# Patient Record
Sex: Male | Born: 1985 | Race: White | Hispanic: No | Marital: Single | Smoking: Current every day smoker
Health system: Southern US, Community
[De-identification: ages and names within clinical notes are randomized; demographics above are authoritative.]

## PROBLEM LIST (undated history)

## (undated) DIAGNOSIS — E119 Type 2 diabetes mellitus without complications: Secondary | ICD-10-CM

---

## 2009-03-20 ENCOUNTER — Emergency Department: Payer: Self-pay | Admitting: Emergency Medicine

## 2011-06-23 ENCOUNTER — Emergency Department: Payer: Self-pay | Admitting: Unknown Physician Specialty

## 2013-03-24 ENCOUNTER — Emergency Department: Payer: Self-pay

## 2013-03-24 DIAGNOSIS — J309 Allergic rhinitis, unspecified: Secondary | ICD-10-CM | POA: Insufficient documentation

## 2015-06-07 ENCOUNTER — Encounter: Payer: Self-pay | Admitting: Emergency Medicine

## 2015-06-07 ENCOUNTER — Other Ambulatory Visit: Payer: Self-pay

## 2015-06-07 ENCOUNTER — Emergency Department
Admission: EM | Admit: 2015-06-07 | Discharge: 2015-06-07 | Disposition: A | Discharge status: Home / Self Care | Payer: 59 | Attending: Emergency Medicine | Admitting: Emergency Medicine

## 2015-06-07 DIAGNOSIS — T675XXA Heat exhaustion, unspecified, initial encounter: Secondary | ICD-10-CM | POA: Insufficient documentation

## 2015-06-07 DIAGNOSIS — R42 Dizziness and giddiness: Secondary | ICD-10-CM | POA: Diagnosis present

## 2015-06-07 DIAGNOSIS — X30XXXA Exposure to excessive natural heat, initial encounter: Secondary | ICD-10-CM | POA: Insufficient documentation

## 2015-06-07 DIAGNOSIS — Y998 Other external cause status: Secondary | ICD-10-CM | POA: Insufficient documentation

## 2015-06-07 DIAGNOSIS — Z720 Tobacco use: Secondary | ICD-10-CM | POA: Diagnosis not present

## 2015-06-07 DIAGNOSIS — Y9289 Other specified places as the place of occurrence of the external cause: Secondary | ICD-10-CM | POA: Diagnosis not present

## 2015-06-07 DIAGNOSIS — Y9389 Activity, other specified: Secondary | ICD-10-CM | POA: Diagnosis not present

## 2015-06-07 DIAGNOSIS — Z880 Allergy status to penicillin: Secondary | ICD-10-CM | POA: Insufficient documentation

## 2015-06-07 LAB — CK: Total CK: 103 U/L (ref 49–397)

## 2015-06-07 LAB — BASIC METABOLIC PANEL
Anion gap: 10 (ref 5–15)
BUN: 14 mg/dL (ref 6–20)
CO2: 23 mmol/L (ref 22–32)
Calcium: 9.9 mg/dL (ref 8.9–10.3)
Chloride: 106 mmol/L (ref 101–111)
Creatinine, Ser: 0.94 mg/dL (ref 0.61–1.24)
GFR calc Af Amer: >60 mL/min (ref >60)
GFR calc non Af Amer: >60 mL/min (ref >60)
Glucose, Bld: 92 mg/dL (ref 65–99)
Potassium: 3.6 mmol/L (ref 3.5–5.1)
SODIUM: 139 mmol/L (ref 135–145)

## 2015-06-07 LAB — GLUCOSE, CAPILLARY: GLUCOSE-CAPILLARY: 92 mg/dL (ref 65–99)

## 2015-06-07 LAB — CBC
HEMATOCRIT: 48.1 % (ref 40.0–52.0)
HEMOGLOBIN: 16.2 g/dL (ref 13.0–18.0)
MCH: 28.3 pg (ref 26.0–34.0)
MCHC: 33.6 g/dL (ref 32.0–36.0)
MCV: 84.2 fL (ref 80.0–100.0)
PLATELETS: 261 10*3/uL (ref 150–440)
RBC: 5.71 MIL/uL (ref 4.40–5.90)
RDW: 13.5 % (ref 11.5–14.5)
WBC: 10.8 10*3/uL — AB (ref 3.8–10.6)

## 2015-06-07 MED ORDER — SODIUM CHLORIDE 0.9 % IV SOLN
Freq: Once | INTRAVENOUS | Status: AC
  Administered 2015-06-07: 14:00:00 via INTRAVENOUS

## 2015-06-07 MED ORDER — SODIUM CHLORIDE 0.9 % IV SOLN
Freq: Once | INTRAVENOUS | Status: AC
  Administered 2015-06-07: 999 mL/h via INTRAVENOUS

## 2015-06-07 NOTE — ED Provider Notes (Signed)
[facility] Emergency Department Provider Note     Time seen: ----------------------------------------- 2:19 PM on 06/07/2015 -----------------------------------------    I have reviewed the triage vital signs and the nursing notes.   HISTORY  Chief Complaint Dizziness    HPI Jose Mcclure is a 29 y.o. male who presents ER with nausea and dizziness while driving today. Patient states after he lies down the dizziness subsides. Complains of feeling weak, states he does work out in the heat often. He had nausea today and couldn't really eat lunch due to same. Denies any fevers,  Chills, or other complaints. Patient denies any pain at this time   History reviewed. No pertinent past medical history.  There are no active problems to display for this patient.   History reviewed. No pertinent past surgical history.  Allergies Hydrocodone and Penicillins  Social History History  Substance Use Topics  . Smoking status: Current Every Day Smoker    Types: Cigarettes  . Smokeless tobacco: Not on file  . Alcohol Use: No   Review of Systems Constitutional: Negative for fever. Eyes: Negative for visual changes. ENT: Negative for sore throat. Cardiovascular: Negative for chest pain. Respiratory: Negative for shortness of breath. Gastrointestinal: Negative for abdominal pain, vomiting and diarrhea. Genitourinary: Negative for dysuria. Musculoskeletal: Negative for back pain. Skin: Negative for rash. Neurological:  positive for weakness and dizziness 10-point ROS otherwise negative.  ____________________________________________   PHYSICAL EXAM:  VITAL SIGNS: ED Triage Vitals  Enc Vitals Group     BP 06/07/15 1336 143/78 mmHg     Pulse Rate 06/07/15 1336 87     Resp 06/07/15 1336 20     Temp 06/07/15 1336 98.2 F (36.8 C)     Temp Source 06/07/15 1336 Oral     SpO2 06/07/15 1336 97 %     Weight 06/07/15 1332 280 lb (127.007 kg)     Height  06/07/15 1332 5\' 7"  (1.702 m)     Head Cir --      Peak Flow --      Pain Score --      Pain Loc --      Pain Edu? --      Excl. in GC? --     Constitutional: Alert and oriented. Well appearing and in no distress. Eyes: Conjunctivae are normal. PERRL. Normal extraocular movements. ENT   Head: Normocephalic and atraumatic.   Nose: No congestion/rhinnorhea.   Mouth/Throat: Mucous membranes are moist.   Neck: No stridor. Cardiovascular: Normal rate, regular rhythm. Normal and symmetric distal pulses are present in all extremities. No murmurs, rubs, or gallops. Respiratory: Normal respiratory effort without tachypnea nor retractions. Breath sounds are clear and equal bilaterally. No wheezes/rales/rhonchi. Gastrointestinal: Soft and nontender. No distention. No abdominal bruits.  Musculoskeletal: Nontender with normal range of motion in all extremities. No joint effusions.  No lower extremity tenderness nor edema. Neurologic:  Normal speech and language. No gross focal neurologic deficits are appreciated. Speech is normal. No gait instability. Skin:  Skin is warm, dry and intact. No rash noted. Psychiatric: Mood and affect are normal. Speech and behavior are normal. Patient exhibits appropriate insight and judgment. ____________________________________________  EKG: Interpreted by menormal sinus rhythm with sinus arrhythmia, normal axis normal intervals. No evidence of hypertrophy or acute infarction. Rate is 81 bpm  ____________________________________________  ED COURSE:  Pertinent labs & imaging results that were available during my care of the patient were reviewed by me and considered in my medical decision making (  see chart for details).  ____________________________________________    LABS (pertinent positives/negatives)  Labs Reviewed  CBC - Abnormal; Notable for the following:    WBC 10.8 (*)    All other components within normal limits  BASIC METABOLIC PANEL   GLUCOSE, CAPILLARY  CK  URINALYSIS COMPLETEWITH MICROSCOPIC ([facility] ONLY)  CBG MONITORING, ED  ____________________________________________  FINAL ASSESSMENT AND PLAN  Heat related illness  Plan: Patient with labs and imaging as dictated above.  patient is received 2 L of saline, labs are as dictated. Patient feels much better, stable for outpatient follow-up as needed.   Emily Filbert, MD   Emily Filbert, MD 06/07/15 [phone]

## 2015-06-07 NOTE — ED Notes (Signed)
C/o dizziness and lightheaded with sob, driving in vehicle when symptoms started suddenly, pt is pale in color and diaphoretic at this time, also having nausea but no vomiting

## 2015-06-07 NOTE — Discharge Instructions (Signed)
Heat-Related Illness °Heat-related illnesses occur when the body is unable to properly cool itself. The body normally cools itself by sweating. However, under some conditions sweating is not enough. In these cases, a person's body temperature rises rapidly. Very high body temperatures may damage the brain or other vital organs. Some examples of heat-related illnesses include: °· Heat stroke. This occurs when the body is unable to regulate its temperature. The body's temperature rises rapidly, the sweating mechanism fails, and the body is unable to cool down. Body temperature may rise to 106° F (41° C) or higher within 10 to 15 minutes. Heat stroke can cause death or permanent disability if emergency treatment is not provided. °· Heat exhaustion. This is a milder form of heat-related illness that can develop after several days of exposure to high temperatures and not enough fluids. It is the body's response to an excessive loss of the water and salt contained in sweat. °· Heat cramps. These usually affect people who sweat a lot during heavy activity. This sweating drains the body's salt and moisture. The low salt level in the muscles causes painful cramps. Heat cramps may also be a symptom of heat exhaustion. Heat cramps usually occur in the abdomen, arms, or legs. Get medical attention for cramps if you have heart problems or are on a low-sodium diet. °Those that are at greatest risk for heat-related illnesses include:  °· The elderly. °· Infant and the very young. °· People with mental illness and chronic diseases. °· People who are overweight (obese). °· Young and healthy people can even succumb to heat if they participate in strenuous physical activities during hot weather. °CAUSES  °Several factors affect the body's ability to cool itself during extremely hot weather. When the humidity is high, sweat will not evaporate as quickly. This prevents the body from releasing heat quickly. Other factors that can affect  the body's ability to cool down include:  °· Age. °· Obesity. °· Fever. °· Dehydration. °· Heart disease. °· Mental illness. °· Poor circulation. °· Sunburn. °· Prescription drug use. °· Alcohol use. °SYMPTOMS  °Heat stroke: Warning signs of heat stroke vary, but may include: °· An extremely high body temperature (above 103°F orally). °· A fast, strong pulse. °· Dizziness. °· Confusion. °· Red, hot, and dry skin. °· No sweating. °· Throbbing headache. °· Feeling sick to your stomach (nauseous). °· Unconsciousness. °Heat exhaustion: Warning signs of heat exhaustion include: °· Heavy sweating. °· Tiredness. °· Headache. °· Paleness. °· Weakness. °· Feeling sick to your stomach (nauseous) or vomiting. °· Muscle cramps. °Heat cramps °· Muscle pains or spasms. °TREATMENT  °Heat stroke °· Get into a cool environment. An indoor place that is air-conditioned may be best. °· Take a cool shower or bath. Have someone around to make sure you are okay. °· Take your temperature. Make sure it is going down. °Heat exhaustion °· Drink plenty of fluids. Do not drink liquids that contain caffeine, alcohol, or large amounts of sugar. These cause you to lose more body fluid. Also, avoid very cold drinks. They can cause stomach cramps. °· Get into a cool environment. An indoor place that is air-conditioned may be best. °· Take a cool shower or bath. Have someone around to make sure you are okay. °· Put on lightweight clothing. °Heat cramps °· Stop whatever activity you were doing. Do not attempt to do that activity for at least 3 hours after the cramps have gone away. °· Get into a cool environment. An indoor   place that is air-conditioned may be best. °HOME CARE INSTRUCTIONS  °To protect your health when temperatures are extremely high, follow these tips: °· During heavy exercise in a hot environment, drink two to four glasses (16-32 ounces) of cool fluids each hour. Do not wait until you are thirsty to drink. Warning: If your caregiver  limits the amount of fluid you drink or has you on water pills, ask how much you should drink while the weather is hot. °· Do not drink liquids that contain caffeine, alcohol, or large amounts of sugar. These cause you to lose more body fluid. °· Avoid very cold drinks. They can cause stomach cramps. °· Wear appropriate clothing. Choose lightweight, light-colored, loose-fitting clothing. °· If you must be outdoors, try to limit your outdoor activity to morning and evening hours. Try to rest often in shady areas. °· If you are not used to working or exercising in a hot environment, start slowly and pick up the pace gradually. °· Stay cool in an air-conditioned place if possible. If your home does not have air conditioning, go to the shopping mall or public library. °· Taking a cool shower or bath may help you cool off. °SEEK MEDICAL CARE IF:  °· You see any of the symptoms listed above. You may be dealing with a life-threatening emergency. °· Symptoms worsen or last longer than 1 hour. °· Heat cramps do not get better in 1 hour. °MAKE SURE YOU:  °· Understand these instructions. °· Will watch your condition. °· Will get help right away if you are not doing well or get worse. °Document Released: 08/06/2008 Document Revised: 01/20/2012 Document Reviewed: 08/06/2008 °ExitCare® Patient Information ©2015 [employer]. This information is not intended to replace advice given to you by your health care provider. Make sure you discuss any questions you have with your health care provider. ° °

## 2015-06-07 NOTE — ED Notes (Signed)
Pt to ED with c/o nausea and dizziness while driving today.  Advises after lying down the dizziness subsided.  Pt now only complains of felling weak.

## 2019-10-14 ENCOUNTER — Encounter: Payer: Self-pay | Admitting: Emergency Medicine

## 2019-10-14 ENCOUNTER — Emergency Department: Payer: Managed Care, Other (non HMO)

## 2019-10-14 ENCOUNTER — Other Ambulatory Visit: Payer: Self-pay

## 2019-10-14 ENCOUNTER — Emergency Department
Admission: EM | Admit: 2019-10-14 | Discharge: 2019-10-14 | Disposition: A | Discharge status: Home / Self Care | Payer: Managed Care, Other (non HMO) | Attending: Emergency Medicine | Admitting: Emergency Medicine

## 2019-10-14 DIAGNOSIS — K760 Fatty (change of) liver, not elsewhere classified: Secondary | ICD-10-CM | POA: Insufficient documentation

## 2019-10-14 DIAGNOSIS — R1011 Right upper quadrant pain: Secondary | ICD-10-CM | POA: Diagnosis present

## 2019-10-14 DIAGNOSIS — F17210 Nicotine dependence, cigarettes, uncomplicated: Secondary | ICD-10-CM | POA: Insufficient documentation

## 2019-10-14 LAB — COMPREHENSIVE METABOLIC PANEL
ALT: 35 U/L (ref 0–44)
AST: 26 U/L (ref 15–41)
Albumin: 4.3 g/dL (ref 3.5–5.0)
Alkaline Phosphatase: 49 U/L (ref 38–126)
Anion gap: 9 (ref 5–15)
BUN: 14 mg/dL (ref 6–20)
CO2: 23 mmol/L (ref 22–32)
Calcium: 9 mg/dL (ref 8.9–10.3)
Chloride: 105 mmol/L (ref 98–111)
Creatinine, Ser: 0.91 mg/dL (ref 0.61–1.24)
GFR calc Af Amer: >60 mL/min (ref >60)
GFR calc non Af Amer: >60 mL/min (ref >60)
Glucose, Bld: 79 mg/dL (ref 70–99)
Potassium: 3.8 mmol/L (ref 3.5–5.1)
Sodium: 137 mmol/L (ref 135–145)
Total Bilirubin: 0.7 mg/dL (ref 0.3–1.2)
Total Protein: 7.9 g/dL (ref 6.5–8.1)

## 2019-10-14 LAB — URINALYSIS, COMPLETE (UACMP) WITH MICROSCOPIC
Bacteria, UA: NONE SEEN
Bilirubin Urine: NEGATIVE
Glucose, UA: NEGATIVE mg/dL
Hgb urine dipstick: NEGATIVE
Ketones, ur: NEGATIVE mg/dL
Leukocytes,Ua: NEGATIVE
Nitrite: NEGATIVE
Protein, ur: NEGATIVE mg/dL
Specific Gravity, Urine: 1.013 (ref 1.005–1.030)
pH: 6 (ref 5.0–8.0)

## 2019-10-14 LAB — CBC
HCT: 47.8 % (ref 39.0–52.0)
Hemoglobin: 16.1 g/dL (ref 13.0–17.0)
MCH: 28.7 pg (ref 26.0–34.0)
MCHC: 33.7 g/dL (ref 30.0–36.0)
MCV: 85.2 fL (ref 80.0–100.0)
Platelets: 295 10*3/uL (ref 150–400)
RBC: 5.61 MIL/uL (ref 4.22–5.81)
RDW: 13.2 % (ref 11.5–15.5)
WBC: 11.5 10*3/uL — ABNORMAL HIGH (ref 4.0–10.5)
nRBC: 0 % (ref 0.0–0.2)

## 2019-10-14 LAB — LIPASE, BLOOD: Lipase: 37 U/L (ref 11–51)

## 2019-10-14 MED ORDER — FAMOTIDINE 20 MG PO TABS: 20.0000 mg | ORAL_TABLET | Freq: Two times a day (BID) | ORAL | 0 refills | Status: AC

## 2019-10-14 NOTE — ED Notes (Signed)
See triage note  Presents with epigastric pain   Pain increases after eating  Pain is mainly to right side of abd  Tender to touch

## 2019-10-14 NOTE — ED Notes (Signed)
Pt c/o pain to mid epigastric area and directly to the right for past couple of days worsening after he eats. Pt reports went to UC and was advised to come to the ED to have an [place] completed to check his gall bladder.

## 2019-10-14 NOTE — ED Provider Notes (Signed)
[facility] Emergency Department Provider Note  ____________________________________________   First MD Initiated Contact with Patient 10/14/19 1347     (approximate)  I have reviewed the triage vital signs and the nursing notes.   HISTORY  Chief Complaint Abdominal Pain   HPI Jose Mcclure is a 33 y.o. male presents to the ED with complaint of right upper quadrant pain shortly after eating to biscuits from [employer].  Patient states that he went to urgent care where he was tender in the right upper quadrant.  Patient denies any indigestion, nausea, vomiting, reflux symptoms.  Patient denies any known gallstones.  He does report that for the last several days it is worse after he eats.  Currently rates his pain as a 4 out of 10.     History reviewed. No pertinent past medical history.  There are no active problems to display for this patient.   History reviewed. No pertinent surgical history.  Prior to Admission medications   Medication Sig Start Date End Date Taking? Authorizing Provider  famotidine (PEPCID) 20 MG tablet Take 1 tablet (20 mg total) by mouth 2 (two) times daily. 10/14/19 10/13/20  Tommi Rumps, PA-C    Allergies Hydrocodone and Penicillins  No family history on file.  Social History Social History   Tobacco Use  . Smoking status: Current Every Day Smoker    Types: Cigarettes  Substance Use Topics  . Alcohol use: No  . Drug use: Yes    Types: Marijuana    Review of Systems Constitutional: No fever/chills Eyes: No visual changes. ENT: No sore throat. Cardiovascular: Denies chest pain. Respiratory: Denies shortness of breath. Gastrointestinal: Positive right upper abdominal pain.  No nausea, no vomiting.  No diarrhea.  No constipation. Genitourinary: Negative for dysuria. Musculoskeletal: Negative for back pain. Skin: Negative for rash. Neurological: Negative for headaches, focal weakness or numbness.  ____________________________________________   PHYSICAL EXAM:  VITAL SIGNS: ED Triage Vitals  Enc Vitals Group     BP 10/14/19 1208 132/80     Pulse Rate 10/14/19 1208 76     Resp 10/14/19 1208 19     Temp 10/14/19 1208 (!) 97.5 F (36.4 C)     Temp Source 10/14/19 1208 Oral     SpO2 10/14/19 1208 96 %     Weight 10/14/19 1208 280 lb (127 kg)     Height 10/14/19 1208 5\' 8"  (1.727 m)     Head Circumference --      Peak Flow --      Pain Score 10/14/19 1210 4     Pain Loc --      Pain Edu? --      Excl. in GC? --     Constitutional: Alert and oriented. Well appearing and in no acute distress. Eyes: Conjunctivae are normal.  Head: Atraumatic. Neck: No stridor.   Cardiovascular: Normal rate, regular rhythm. Grossly normal heart sounds.  Good peripheral circulation. Respiratory: Normal respiratory effort.  No retractions. Lungs CTAB. Gastrointestinal: Soft with right upper quadrant pain.  No epigastric tenderness is noted.  No distention.  Bowel sounds are normoactive x4 quadrants. Musculoskeletal: Moves upper and lower extremities with any difficulty.  Normal gait was noted. Neurologic:  Normal speech and language. No gross focal neurologic deficits are appreciated. No gait instability. Skin:  Skin is warm, dry and intact. No rash noted. Psychiatric: Mood and affect are normal. Speech and behavior are normal.  ____________________________________________   LABS (all labs ordered are listed,  but only abnormal results are displayed)  Labs Reviewed  CBC - Abnormal; Notable for the following components:      Result Value   WBC 11.5 (*)    All other components within normal limits  URINALYSIS, COMPLETE (UACMP) WITH MICROSCOPIC - Abnormal; Notable for the following components:   Color, Urine YELLOW (*)    APPearance CLEAR (*)    All other components within normal limits  LIPASE, BLOOD  COMPREHENSIVE METABOLIC PANEL    RADIOLOGY   Official radiology report(s): US  Abdomen Limited Ruq  Result Date: 10/14/2019 CLINICAL DATA:  Right upper quadrant pain EXAM: ULTRASOUND ABDOMEN LIMITED RIGHT UPPER QUADRANT COMPARISON:  None. FINDINGS: Gallbladder: No gallstones or wall thickening visualized. No sonographic Murphy sign noted by sonographer. Common bile duct: Diameter: 1.7 mm Liver: Liver is enlarged with right lobe measurement of 19 cm. Liver is slightly echogenic. Focal hypoechoic area near the gallbladder fossa. Portal vein is patent on color Doppler imaging with normal direction of blood flow towards the liver. Other: None. IMPRESSION: 1. Negative for gallstones or biliary dilatation 2. Enlarged echogenic liver compatible with steatosis with probable fat sparing near the gallbladder fossa Electronically Signed   By: Donavan Foil M.D.   On: 10/14/2019 15:37    ____________________________________________   PROCEDURES  Procedure(s) performed (including Critical Care):  Procedures   ____________________________________________   INITIAL IMPRESSION / ASSESSMENT AND PLAN / ED COURSE  As part of my medical decision making, I reviewed the following data within the electronic MEDICAL RECORD NUMBER Notes from prior ED visits and [place] Controlled Substance Database  33 year old male presents to the ED with complaint of right upper quadrant pain that occurred shortly after eating 2 biscuits this morning from [place].  He was seen at urgent care where he was tender in the right upper quadrant and was sent to the ED for evaluation of possible gallstones.  Patient was unremarkable and ultrasound did not show any gallstones.  Patient does have some liver changes with enlargement in the right upper lobe.  We discussed at length patient's eating habits and he is to refrain from fried, greasy foods and also decrease or discontinue his intake of any alcohol.  He is to call and make an appointment with the gastroenterologist on-call and that information was provided for him.   Patient is aware that he may return to the emergency department if any severe worsening of his symptoms.  ____________________________________________   FINAL CLINICAL IMPRESSION(S) / ED DIAGNOSES  Final diagnoses:  Steatosis of liver  Right upper quadrant abdominal pain     ED Discharge Orders         Ordered    famotidine (PEPCID) 20 MG tablet  2 times daily     10/14/19 1556           Note:  This document was prepared using Dragon voice recognition software and may include unintentional dictation errors.    Johnn Hai, PA-C 10/14/19 1616    Blake Divine, MD 10/15/19 [id number]

## 2019-10-14 NOTE — ED Triage Notes (Signed)
PT sent from UC for Rt sided abd pain. PT states when doctor palpated rt side he " came off the table". VS

## 2020-12-02 ENCOUNTER — Emergency Department (HOSPITAL_COMMUNITY)
Admission: EM | Admit: 2020-12-02 | Discharge: 2020-12-03 | Disposition: A | Discharge status: Home / Self Care | Payer: PRIVATE HEALTH INSURANCE | Attending: Emergency Medicine | Admitting: Emergency Medicine

## 2020-12-02 ENCOUNTER — Other Ambulatory Visit: Payer: Self-pay

## 2020-12-02 DIAGNOSIS — W228XXA Striking against or struck by other objects, initial encounter: Secondary | ICD-10-CM | POA: Diagnosis not present

## 2020-12-02 DIAGNOSIS — H5712 Ocular pain, left eye: Secondary | ICD-10-CM | POA: Insufficient documentation

## 2020-12-02 DIAGNOSIS — S0592XA Unspecified injury of left eye and orbit, initial encounter: Secondary | ICD-10-CM | POA: Insufficient documentation

## 2020-12-02 DIAGNOSIS — Z5321 Procedure and treatment not carried out due to patient leaving prior to being seen by health care provider: Secondary | ICD-10-CM | POA: Insufficient documentation

## 2020-12-02 DIAGNOSIS — Y9329 Activity, other involving ice and snow: Secondary | ICD-10-CM | POA: Diagnosis not present

## 2020-12-02 DIAGNOSIS — Y92009 Unspecified place in unspecified non-institutional (private) residence as the place of occurrence of the external cause: Secondary | ICD-10-CM | POA: Diagnosis not present

## 2020-12-02 NOTE — ED Triage Notes (Signed)
Pt was at home last night playing in the snow and was hit in the eye with snow. Pt said he woke up this morning with pain and redness. Pt said he feels like there is something in his eye and there is a lot of pressure in his left sinuses under his eye.

## 2020-12-03 NOTE — ED Notes (Signed)
Pt stated, "who do I give this to? I aint waiting 5 hours. My eye will be shut by then." Pt has walked out ED doors.

## 2021-03-09 IMAGING — US US ABDOMEN LIMITED
1 series · 14 of 25 positions shown · non-contrast
Comparison: None.

CLINICAL DATA: Right upper quadrant pain

EXAM:
ULTRASOUND ABDOMEN LIMITED RIGHT UPPER QUADRANT

[Series 1: us abdomen limited · 14 of 49 slices shown]
[im 1/49]
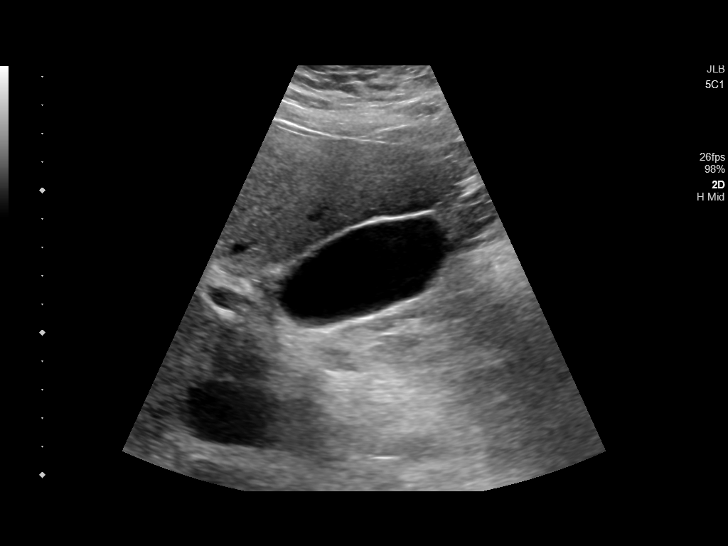
[im 5/49]
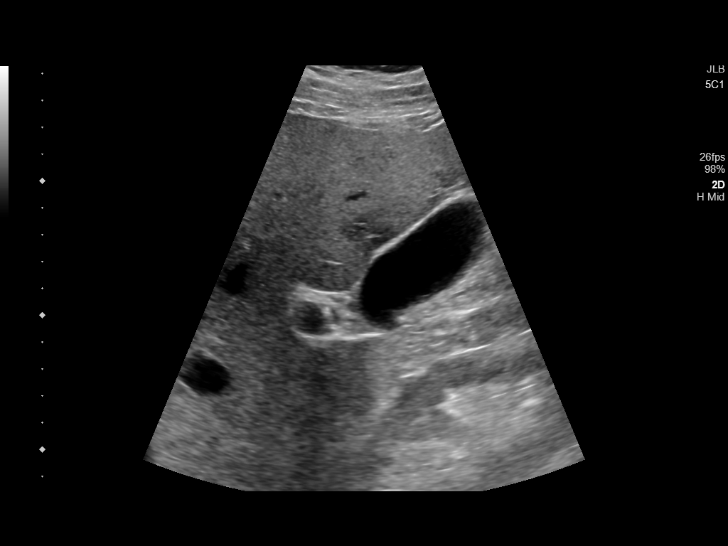
[im 9/49]
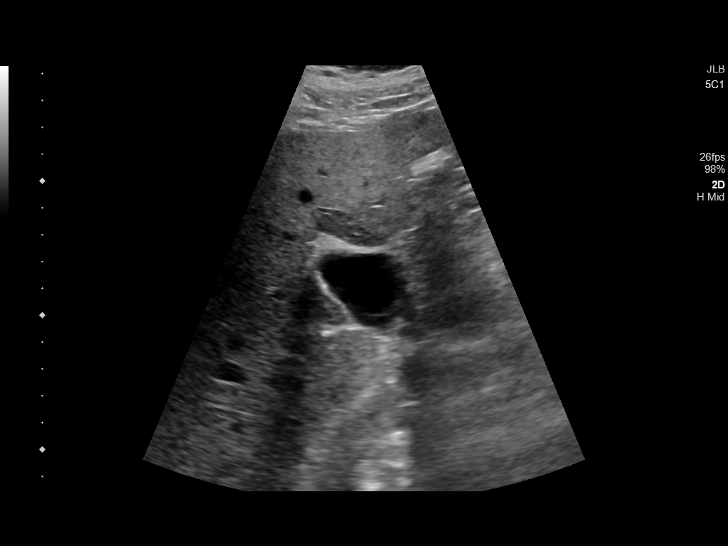
[im 13/49]
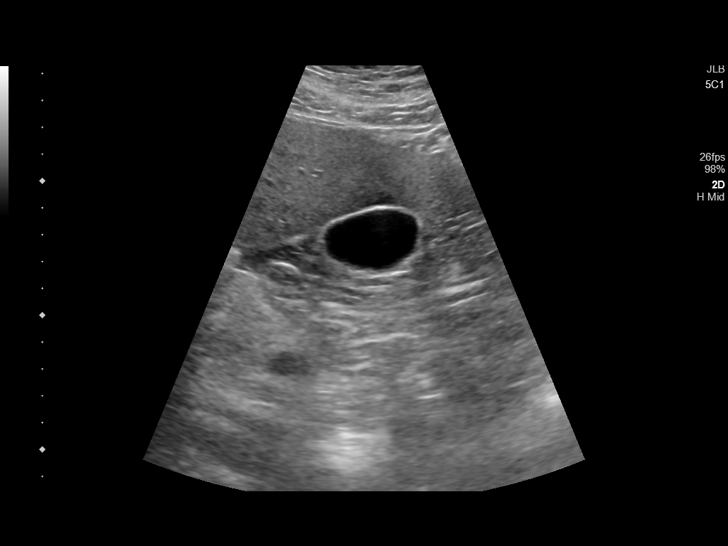
[im 17/49]
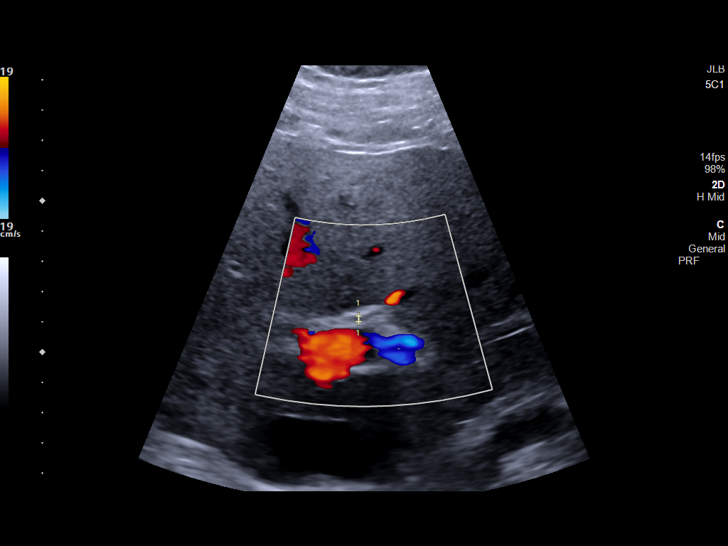
[im 19/49]
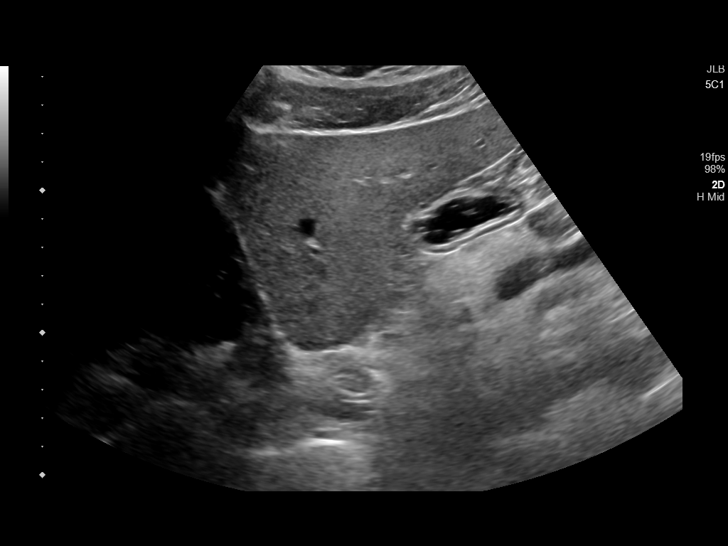
[im 23/49]
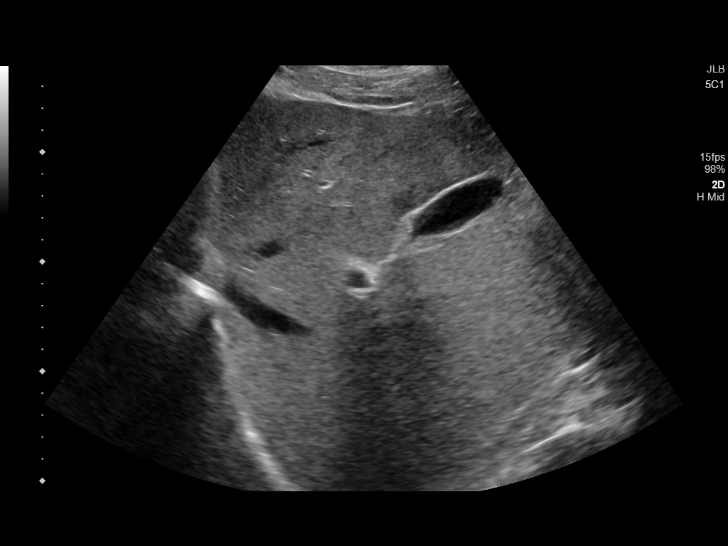
[im 27/49]
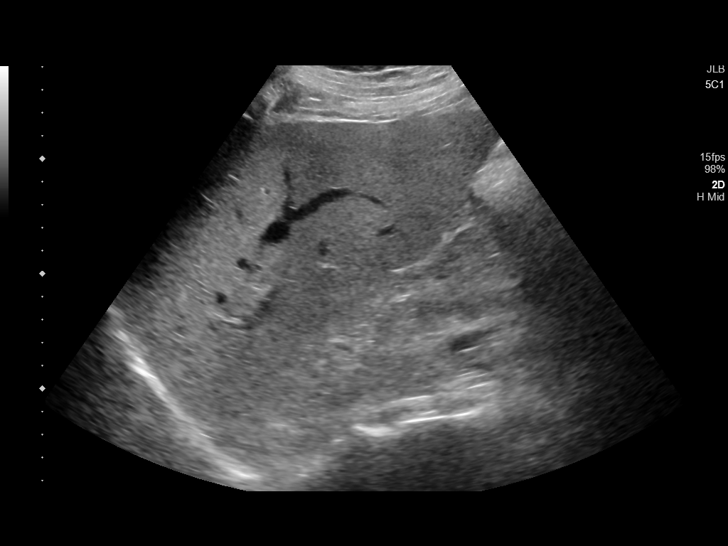
[im 31/49]
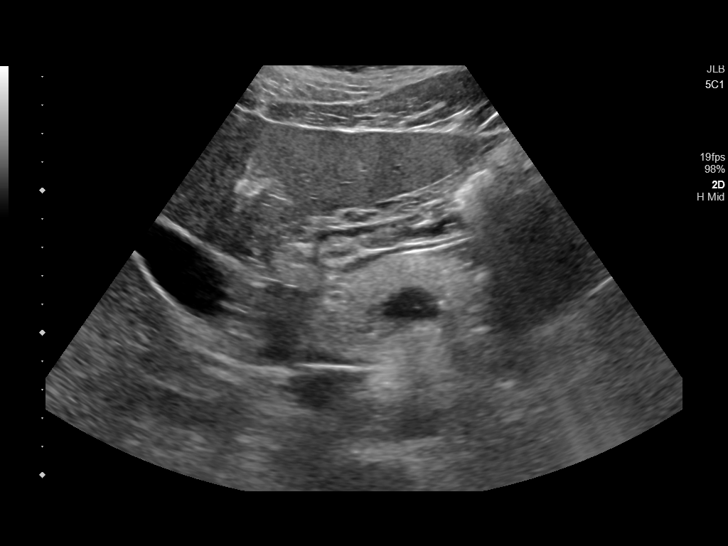
[im 33/49]
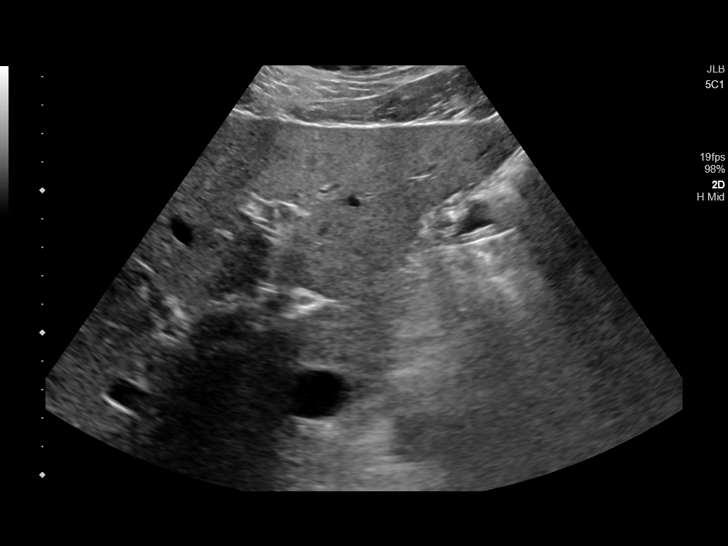
[im 37/49]
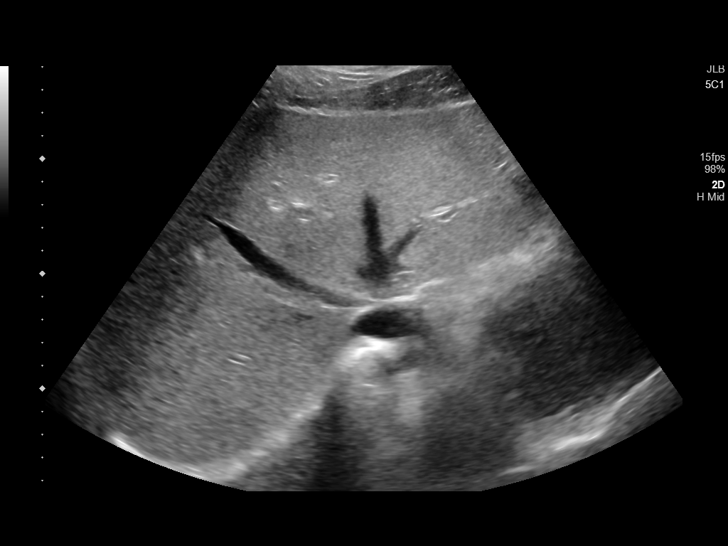
[im 41/49]
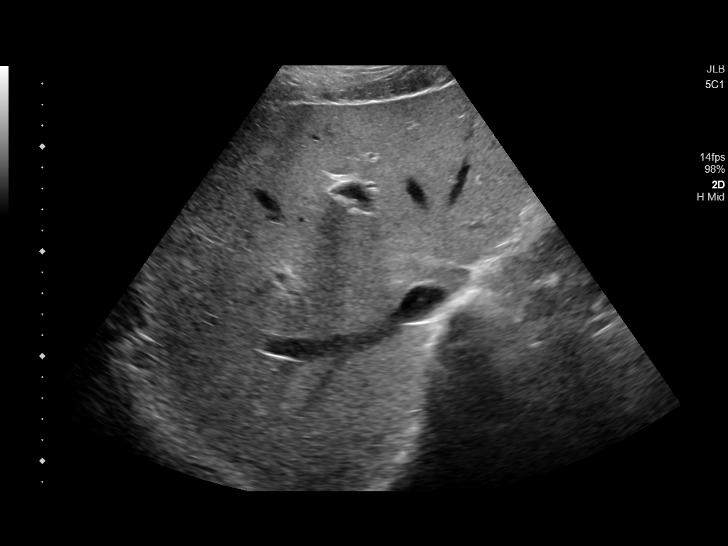
[im 45/49]
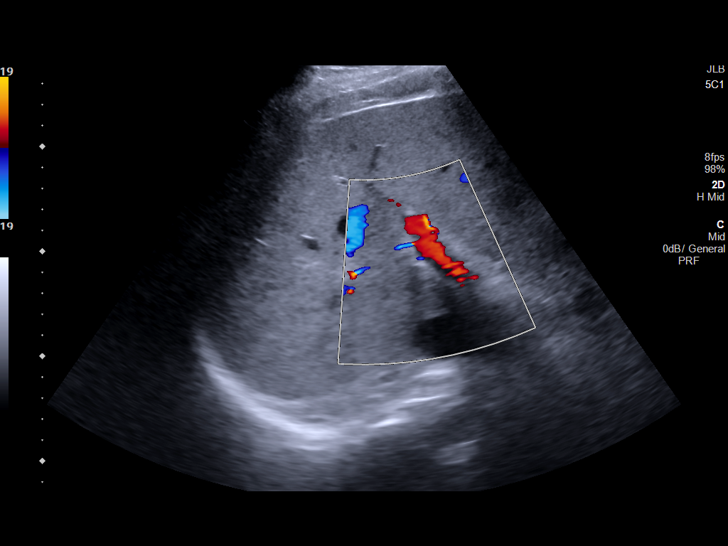
[im 49/49]
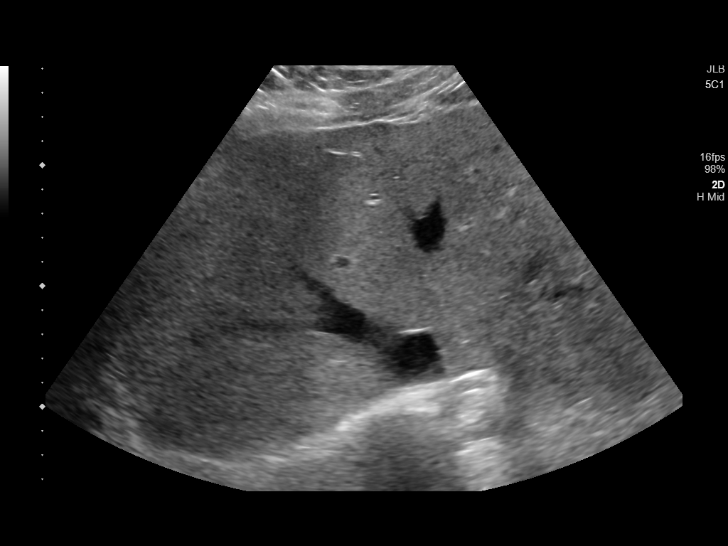

[14 of 25 positions shown; findings below may reference images not displayed]

FINDINGS: Gallbladder:

No gallstones or wall thickening visualized. No sonographic Murphy
sign noted by sonographer.

Common bile duct:

Diameter: 1.7 mm

Liver:

Liver is enlarged with right lobe measurement of 19 cm. Liver is
slightly echogenic. Focal hypoechoic area near the gallbladder
fossa. Portal vein is patent on color Doppler imaging with normal
direction of blood flow towards the liver.

Other: None.
IMPRESSION: 1. Negative for gallstones or biliary dilatation
2. Enlarged echogenic liver compatible with steatosis with probable
fat sparing near the gallbladder fossa

## 2024-02-26 ENCOUNTER — Encounter: Payer: Self-pay | Admitting: Emergency Medicine

## 2024-02-26 ENCOUNTER — Ambulatory Visit
Admission: EM | Admit: 2024-02-26 | Discharge: 2024-02-26 | Disposition: A | Discharge status: Home / Self Care | Attending: Family Medicine | Admitting: Family Medicine

## 2024-02-26 DIAGNOSIS — B349 Viral infection, unspecified: Secondary | ICD-10-CM

## 2024-02-26 HISTORY — DX: Type 2 diabetes mellitus without complications: E11.9

## 2024-02-26 MED ORDER — IBUPROFEN 800 MG PO TABS: 800.0000 mg | ORAL_TABLET | Freq: Three times a day (TID) | ORAL | 0 refills | Status: AC

## 2024-02-26 MED ORDER — AZELASTINE HCL 0.1 % NA SOLN
1.0000 | Freq: Two times a day (BID) | NASAL | 1 refills | Status: AC
Start: 2024-02-26 — End: ?

## 2024-02-26 NOTE — ED Provider Notes (Signed)
 [facility]  Note:  This document was prepared using Conservation officer, historic buildings and may include unintentional dictation errors.  MRN: [id number] DOB: October 05, 1986  Subjective:   Jose Mcclure is a 38 y.o. male presenting for body aches, sinus pressure, nasal congestion, fatigue x 3 to 4 days.  Patient reports that he started having sinus pressure which made him believe that he was getting a sinus infection.  Patient states that he took 2 days worth of azithromycin that his wife had leftover from previous sinus infection and states that he feels worse over the last 2 days.  Patient has been taking Tylenol with no improvement to body aches or symptoms.  Patient reports he had a fever of 100.6 the other day.  No shortness of breath, chest pain, weakness, dizziness.  No current facility-administered medications for this encounter.  Current Outpatient Medications:    acetaminophen (TYLENOL) 325 MG tablet, Take 650 mg by mouth every 6 (six) hours as needed., Disp: , Rfl:    azelastine (ASTELIN) 0.1 % nasal spray, Place 1 spray into both nostrils 2 (two) times daily. Use in each nostril as directed, Disp: 30 mL, Rfl: 1   cetirizine (ZYRTEC) 10 MG tablet, Take 1 tablet by mouth daily., Disp: , Rfl:    cyclobenzaprine (FLEXERIL) 10 MG tablet, Take by mouth., Disp: , Rfl:    erythromycin ophthalmic ointment, Place a 1/2 inch ribbon of ointment into the lower eyelid. TID, Disp: , Rfl:    ibuprofen (ADVIL) 800 MG tablet, Take 1 tablet (800 mg total) by mouth 3 (three) times daily., Disp: 21 tablet, Rfl: 0   metFORMIN (GLUCOPHAGE) 1000 MG tablet, Take by mouth., Disp: , Rfl:    metFORMIN (GLUCOPHAGE) 500 MG tablet, Take by mouth., Disp: , Rfl:    rosuvastatin (CRESTOR) 5 MG tablet, Take by mouth., Disp: , Rfl:    famotidine (PEPCID) 20 MG tablet, Take 1 tablet (20 mg total) by mouth 2 (two) times daily., Disp: 60 tablet, Rfl: 0   Allergies  Allergen Reactions   Hydrocodone Rash    Penicillins Rash and Other (See Comments)    Unknown as a kid. Tolerates ceftriaxone.    Past Medical History:  Diagnosis Date   Diabetes mellitus without complication (HCC)      History reviewed. No pertinent surgical history.  History reviewed. No pertinent family history.  Social History   Tobacco Use   Smoking status: Every Day    Types: Cigarettes    Passive exposure: Current  Vaping Use   Vaping status: Every Day   Substances: Nicotine, THC  Substance Use Topics   Alcohol use: No   Drug use: Yes    Types: Marijuana    ROS Refer to HPI for ROS details.  Objective:   Vitals: BP 121/79 (BP Location: Left Arm)   Pulse 99   Temp 98.2 F (36.8 C) (Oral)   Resp 18   Wt 279 lb 15.8 oz (127 kg)   SpO2 99%   BMI 42.57 kg/m   Physical Exam Vitals and nursing note reviewed.  Constitutional:      General: He is not in acute distress.    Appearance: He is well-developed. He is not ill-appearing or toxic-appearing.  HENT:     Head: Normocephalic.     Nose: Mucosal edema, congestion and rhinorrhea present. No nasal tenderness.     Right Sinus: Maxillary sinus tenderness present. No frontal sinus tenderness.     Left Sinus: Maxillary sinus tenderness present.  No frontal sinus tenderness.     Mouth/Throat:     Mouth: Mucous membranes are moist.  Eyes:     Extraocular Movements: Extraocular movements intact.     Conjunctiva/sclera: Conjunctivae normal.  Cardiovascular:     Rate and Rhythm: Normal rate and regular rhythm.     Heart sounds: No murmur heard. Pulmonary:     Effort: Pulmonary effort is normal. No respiratory distress.     Breath sounds: Normal breath sounds. No stridor. No wheezing, rhonchi or rales.  Skin:    General: Skin is warm and dry.  Neurological:     General: No focal deficit present.     Mental Status: He is alert and oriented to person, place, and time.  Psychiatric:        Mood and Affect: Mood normal.        Behavior: Behavior  normal.     Procedures  No results found for this or any previous visit (from the past 24 hours).  Assessment and Plan :     Discharge Instructions      1. Viral syndrome (Primary) - ibuprofen (ADVIL) 800 MG tablet; Take 1 tablet (800 mg total) by mouth 3 (three) times daily.  Dispense: 21 tablet; Refill: 0 - azelastine (ASTELIN) 0.1 % nasal spray; Place 1 spray into both nostrils 2 (two) times daily. Use in each nostril as directed  Dispense: 30 mL; Refill: 1 - Take 1 extra pill daily cetirizine for the next week to suppress histamine during viral illness. - You may take ibuprofen while taking metformin without any significant side effects.  Your kidney function is well within normal limits which is the main concern for taking ibuprofen and metformin together. -Continue to monitor symptoms for any change in severity if there is any escalation of current symptoms or development of new symptoms follow-up in ER for further evaluation and management.        Elonna Mcfarlane B Jose Mcclure   Jose Mcclure, [place], [place] 02/26/24 1506

## 2024-02-26 NOTE — ED Triage Notes (Addendum)
 Pt c/o sxs listed x 4 days. Pt denies emesis and diarrhea. Pt states he took of his wife's leftover Zpack for the last 2 days.

## 2024-02-26 NOTE — Discharge Instructions (Addendum)
 1. Viral syndrome (Primary) - ibuprofen (ADVIL) 800 MG tablet; Take 1 tablet (800 mg total) by mouth 3 (three) times daily.  Dispense: 21 tablet; Refill: 0 - azelastine (ASTELIN) 0.1 % nasal spray; Place 1 spray into both nostrils 2 (two) times daily. Use in each nostril as directed  Dispense: 30 mL; Refill: 1 - Take 1 extra pill daily cetirizine for the next week to suppress histamine during viral illness. - You may take ibuprofen while taking metformin without any significant side effects.  Your kidney function is well within normal limits which is the main concern for taking ibuprofen and metformin together. -Continue to monitor symptoms for any change in severity if there is any escalation of current symptoms or development of new symptoms follow-up in ER for further evaluation and management.
